# Patient Record
Sex: Female | Born: 1975
Health system: Southern US, Community
[De-identification: ages and names within clinical notes are randomized; demographics above are authoritative.]

## PROBLEM LIST (undated history)

## (undated) DIAGNOSIS — F909 Attention-deficit hyperactivity disorder, unspecified type: Secondary | ICD-10-CM

## (undated) DIAGNOSIS — B009 Herpesviral infection, unspecified: Secondary | ICD-10-CM

## (undated) DIAGNOSIS — Z9109 Other allergy status, other than to drugs and biological substances: Secondary | ICD-10-CM

## (undated) DIAGNOSIS — E079 Disorder of thyroid, unspecified: Secondary | ICD-10-CM

## (undated) DIAGNOSIS — B019 Varicella without complication: Secondary | ICD-10-CM

## (undated) DIAGNOSIS — N390 Urinary tract infection, site not specified: Secondary | ICD-10-CM

## (undated) HISTORY — DX: Attention-deficit hyperactivity disorder, unspecified type: F90.9

## (undated) HISTORY — DX: Varicella without complication: B01.9

## (undated) HISTORY — DX: Herpesviral infection, unspecified: B00.9

## (undated) HISTORY — DX: Urinary tract infection, site not specified: N39.0

## (undated) HISTORY — DX: Disorder of thyroid, unspecified: E07.9

## (undated) HISTORY — DX: Other allergy status, other than to drugs and biological substances: Z91.09

---
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
## 2009-04-21 ENCOUNTER — Encounter: Admission: RE | Admit: 2009-04-21 | Discharge: 2009-04-21 | Payer: Self-pay | Admitting: Unknown Physician Specialty

## 2010-11-01 IMAGING — CR DG ABDOMEN 2V
3 series · 3 of 3 positions shown · non-contrast
Comparison: None

CLINICAL DATA: Abdominal pain.

ABDOMEN - 2 VIEW

[view not recorded (1 of 3)]
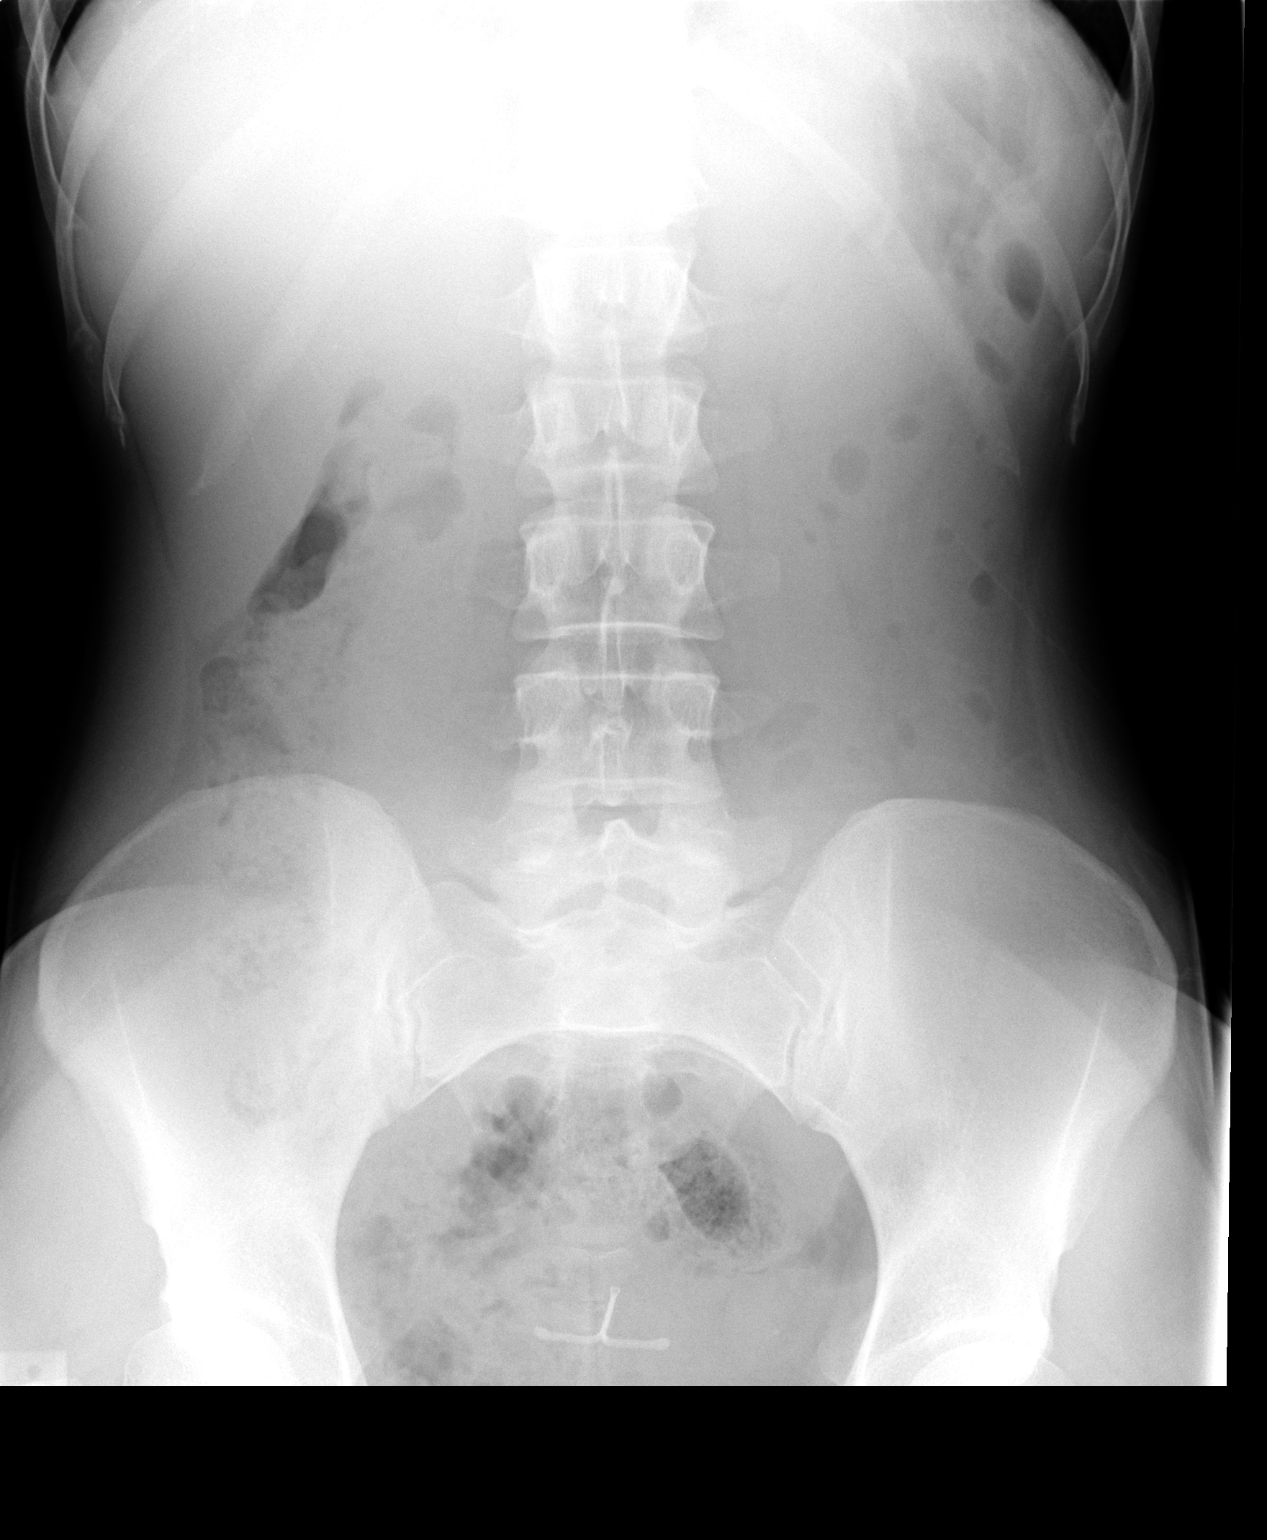

[view not recorded (2 of 3)]
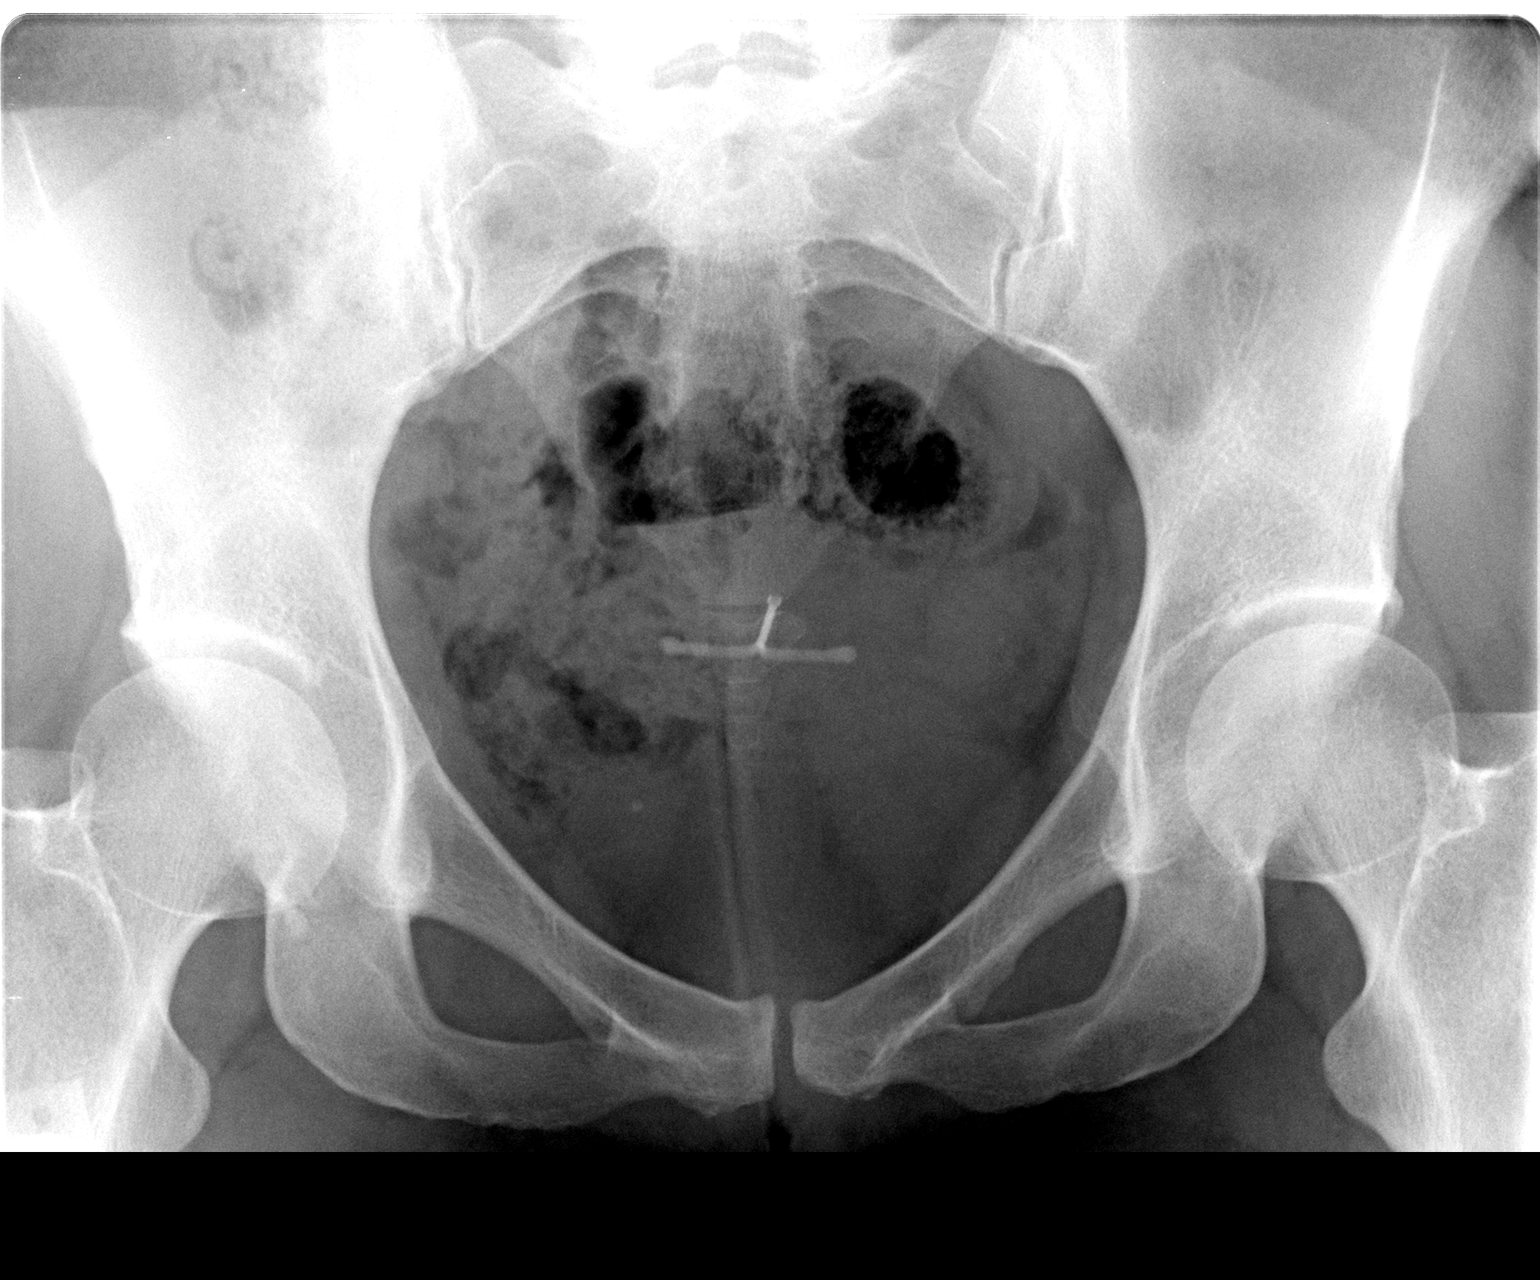

[view not recorded (3 of 3)]
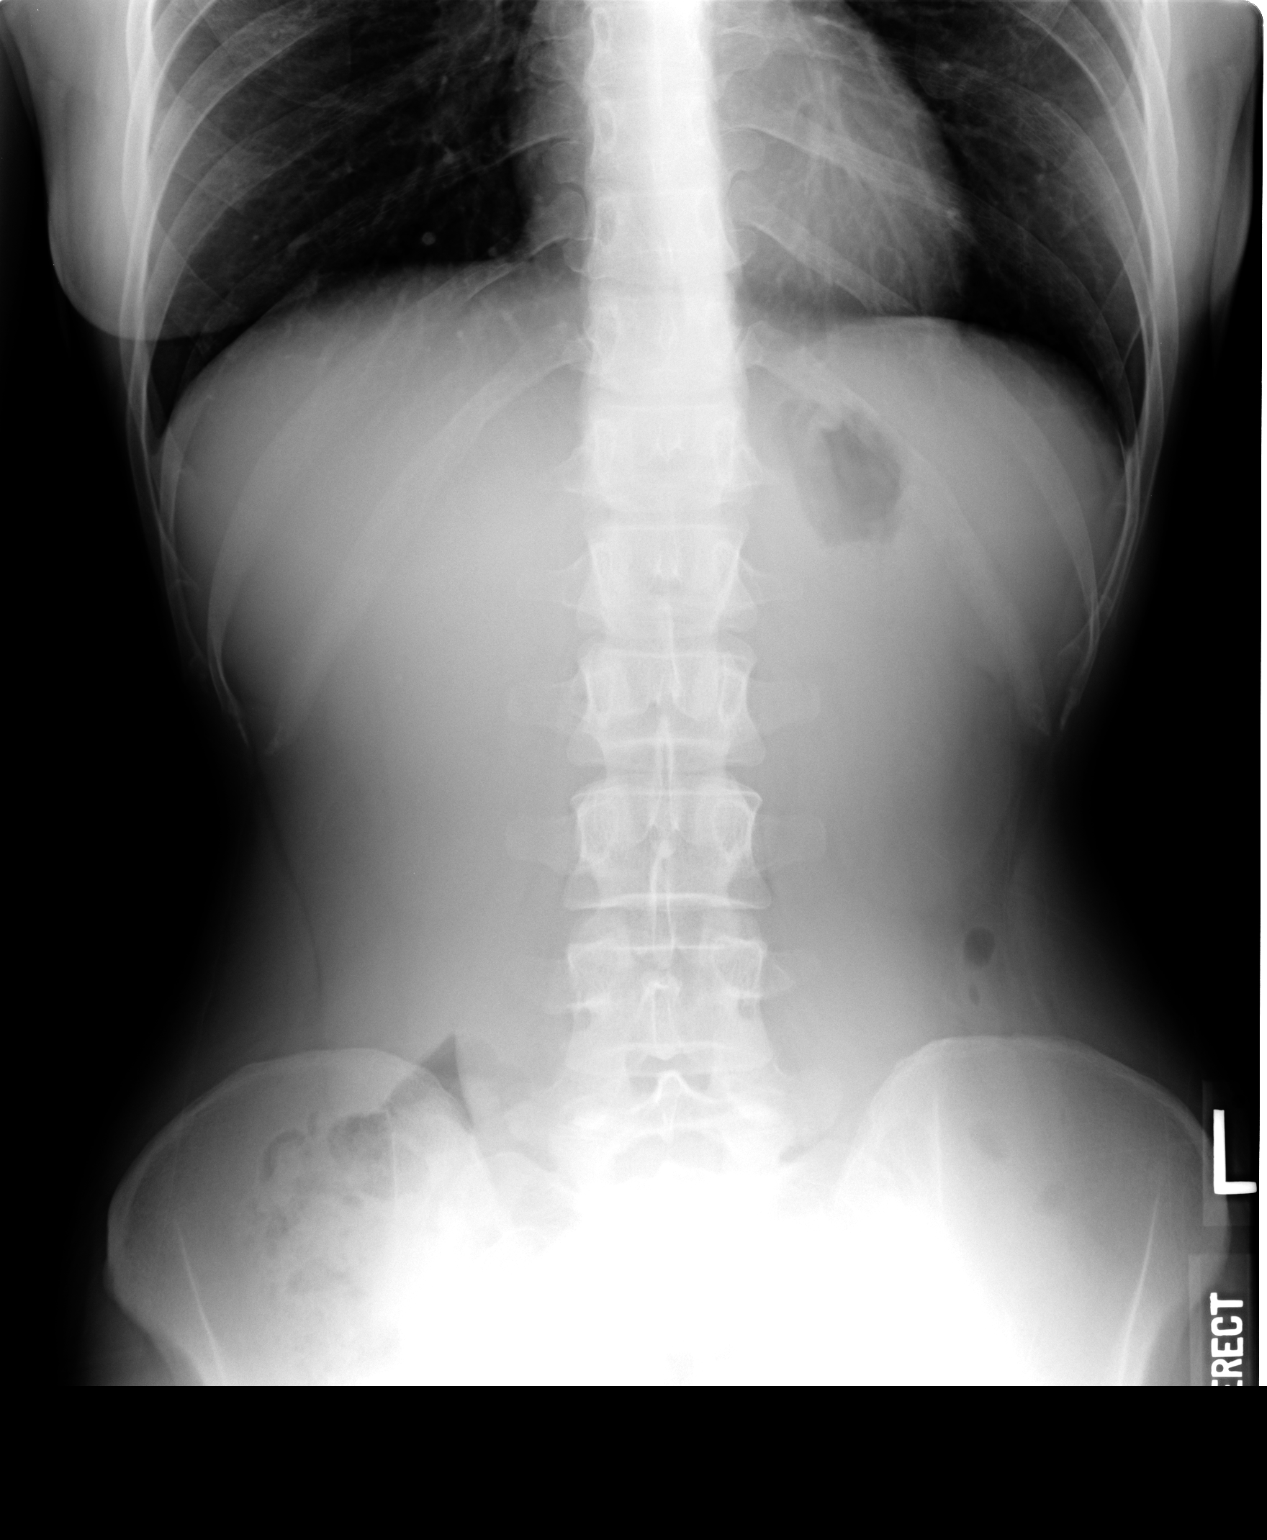

[3 of 3 positions shown; findings below may reference images not displayed]

FINDINGS: IUD noted in place. There is normal bowel gas pattern.
No free air.  No organomegaly or suspicious calcification.
Visualized skeleton unremarkable. Lung bases clear.
IMPRESSION: No acute findings.

## 2015-03-16 DIAGNOSIS — F900 Attention-deficit hyperactivity disorder, predominantly inattentive type: Secondary | ICD-10-CM | POA: Insufficient documentation

## 2016-09-19 LAB — TSH: TSH: 1.06 (ref <=5.90)

## 2017-03-20 LAB — CBC AND DIFFERENTIAL
Hemoglobin: 13.5 (ref 12.0–16.0)
Platelets: 247 (ref 150–399)
WBC: 5.7

## 2017-03-20 LAB — HEPATIC FUNCTION PANEL
ALT: 121 — AB (ref 7–35)
ALT: 20 (ref 7–35)
AST: 176 — AB (ref 13–35)
AST: 21 (ref 13–35)
Bilirubin, Direct: 0.7 — AB

## 2017-03-20 LAB — BASIC METABOLIC PANEL
GLUCOSE: 85
POTASSIUM: 3.7 (ref 3.4–5.3)
Sodium: 140 (ref 137–147)

## 2017-03-20 LAB — HM HEPATITIS C SCREENING LAB: HM HEPATITIS C SCREENING: NEGATIVE

## 2017-03-20 LAB — LIPID PANEL
CHOLESTEROL: 157 (ref 0–200)
HDL: 91 — AB (ref 35–70)
LDL Cholesterol: 62
TRIGLYCERIDES: 46 (ref 40–160)

## 2017-03-20 LAB — TSH: TSH: 1.95 (ref <=5.90)

## 2017-04-08 DIAGNOSIS — B009 Herpesviral infection, unspecified: Secondary | ICD-10-CM

## 2017-04-08 HISTORY — DX: Herpesviral infection, unspecified: B00.9

## 2017-04-08 LAB — HEPATIC FUNCTION PANEL: Bilirubin, Total: 0.8

## 2017-07-17 LAB — TSH: TSH: 1.09 (ref <=5.90)

## 2017-09-10 DIAGNOSIS — J3489 Other specified disorders of nose and nasal sinuses: Secondary | ICD-10-CM | POA: Diagnosis not present

## 2017-09-10 DIAGNOSIS — J342 Deviated nasal septum: Secondary | ICD-10-CM | POA: Diagnosis not present

## 2017-09-10 DIAGNOSIS — S0121XD Laceration without foreign body of nose, subsequent encounter: Secondary | ICD-10-CM | POA: Diagnosis not present

## 2017-09-25 DIAGNOSIS — E039 Hypothyroidism, unspecified: Secondary | ICD-10-CM | POA: Diagnosis not present

## 2017-09-25 LAB — TSH: TSH: 0.75 (ref <=5.90)

## 2017-10-04 DIAGNOSIS — F900 Attention-deficit hyperactivity disorder, predominantly inattentive type: Secondary | ICD-10-CM | POA: Diagnosis not present

## 2017-10-04 DIAGNOSIS — E039 Hypothyroidism, unspecified: Secondary | ICD-10-CM | POA: Diagnosis not present

## 2017-10-14 DIAGNOSIS — H52221 Regular astigmatism, right eye: Secondary | ICD-10-CM | POA: Diagnosis not present

## 2017-10-14 DIAGNOSIS — H5203 Hypermetropia, bilateral: Secondary | ICD-10-CM | POA: Diagnosis not present

## 2017-12-06 DIAGNOSIS — F900 Attention-deficit hyperactivity disorder, predominantly inattentive type: Secondary | ICD-10-CM | POA: Diagnosis not present

## 2017-12-06 DIAGNOSIS — E039 Hypothyroidism, unspecified: Secondary | ICD-10-CM | POA: Diagnosis not present

## 2017-12-06 LAB — TSH: TSH: 1.53 (ref <=5.90)

## 2018-01-04 DIAGNOSIS — J0140 Acute pansinusitis, unspecified: Secondary | ICD-10-CM | POA: Diagnosis not present

## 2018-03-25 DIAGNOSIS — M25552 Pain in left hip: Secondary | ICD-10-CM | POA: Diagnosis not present

## 2018-03-28 DIAGNOSIS — S0121XD Laceration without foreign body of nose, subsequent encounter: Secondary | ICD-10-CM | POA: Diagnosis not present

## 2018-03-28 DIAGNOSIS — J342 Deviated nasal septum: Secondary | ICD-10-CM | POA: Diagnosis not present

## 2018-04-14 DIAGNOSIS — F909 Attention-deficit hyperactivity disorder, unspecified type: Secondary | ICD-10-CM | POA: Diagnosis not present

## 2018-04-14 DIAGNOSIS — Z23 Encounter for immunization: Secondary | ICD-10-CM | POA: Diagnosis not present

## 2018-06-19 DIAGNOSIS — H1033 Unspecified acute conjunctivitis, bilateral: Secondary | ICD-10-CM | POA: Diagnosis not present

## 2018-08-20 DIAGNOSIS — H04123 Dry eye syndrome of bilateral lacrimal glands: Secondary | ICD-10-CM | POA: Diagnosis not present

## 2018-08-20 DIAGNOSIS — R51 Headache: Secondary | ICD-10-CM | POA: Diagnosis not present

## 2018-08-21 ENCOUNTER — Telehealth: Payer: Self-pay

## 2018-08-21 NOTE — Telephone Encounter (Signed)
Called and LM for patient on identified VM (cell phone) letting pt know we were unable to draw labs from patient because we have never seen her and she is a new patient.

## 2018-08-21 NOTE — Telephone Encounter (Signed)
Pt must be evaluated before any labs considered. She is not even established here and we do not have any records on her yet.

## 2018-08-21 NOTE — Telephone Encounter (Signed)
Copied from CRM #[id number]. Topic: General - Other >> Aug 20, 2018  1:59 PM Jaquita Rector A wrote: Reason for CRM: Patient called to say that she saw her eye doctor that think she may have a thyroid issue and would like to come in before her appointment on Monday to have blood work done so it can be discussed at the date of the scheduled appointment. Ph# [phone] >> Aug 20, 2018  2:08 PM Eulah Pont, CMA wrote: This will be a NP appt.  Does Dr Claiborne Billings want to do lab work prior to appointment?

## 2018-08-25 ENCOUNTER — Ambulatory Visit: Payer: BLUE CROSS/BLUE SHIELD | Admitting: Family Medicine

## 2018-08-25 ENCOUNTER — Encounter: Payer: Self-pay | Admitting: Family Medicine

## 2018-08-25 ENCOUNTER — Other Ambulatory Visit: Payer: Self-pay

## 2018-08-25 VITALS — BP 112/74 | HR 83 | Temp 98.1°F | Resp 14 | Ht 66.0 in | Wt 136.0 lb

## 2018-08-25 DIAGNOSIS — Z7689 Persons encountering health services in other specified circumstances: Secondary | ICD-10-CM

## 2018-08-25 DIAGNOSIS — F900 Attention-deficit hyperactivity disorder, predominantly inattentive type: Secondary | ICD-10-CM | POA: Diagnosis not present

## 2018-08-25 DIAGNOSIS — E039 Hypothyroidism, unspecified: Secondary | ICD-10-CM | POA: Insufficient documentation

## 2018-08-25 MED ORDER — LISDEXAMFETAMINE DIMESYLATE 40 MG PO CAPS: 40.0000 mg | ORAL_CAPSULE | ORAL | 0 refills | Status: AC

## 2018-08-25 MED ORDER — LISDEXAMFETAMINE DIMESYLATE 40 MG PO CAPS: 40.0000 mg | ORAL_CAPSULE | ORAL | 0 refills | Status: DC

## 2018-08-25 NOTE — Patient Instructions (Signed)
Verify your dose of thyroid med, so when we call you we can double check for accuracy.   I will refill you vyvanse today for 3 months. Follow up before needing meds.   Schedule your physical when due.  Controlled substance- vyvanse can not be prescribed during a physical or prevention visit.    Please help [place] help you:  We are honored you have chosen [facility] for your Primary Care home. Below you will find basic instructions that you may need to access in the future. Please help [place] help you by reading the instructions, which cover many of the frequent questions we experience.   Prescription refills and request:  -In order to allow more efficient response time, please call your pharmacy for all refills. They will forward the request electronically to [place]. This allows for the quickest possible response. Request left on a nurse line can take longer to refill, since these are checked as time allows between office patients and other phone calls.  - refill request can take up to 3-5 working days to complete.  - If request is sent electronically and request is appropiate, it is usually completed in 1-2 business days.  - all patients will need to be seen routinely for all chronic medical conditions requiring prescription medications (see follow-up below). If you are overdue for follow up on your condition, you will be asked to make an appointment and we will call in enough medication to cover you until your appointment (up to 30 days).  - all controlled substances will require a face to face visit to request/refill.  - if you desire your prescriptions to go through a new pharmacy, and have an active script at original pharmacy, you will need to call your pharmacy and have scripts transferred to new pharmacy. This is completed between the pharmacy locations and not by your provider.    Results: If any images or labs were ordered, it can take up to 1 week to get results depending on the test  ordered and the lab/facility running and resulting the test. - Normal or stable results, which do not need further discussion, may be released to your mychart immediately with attached note to you. A call may not be generated for normal results. Please make certain to sign up for mychart. If you have questions on how to activate your mychart you can call the front office.  - If your results need further discussion, our office will attempt to contact you via phone, and if unable to reach you after 2 attempts, we will release your abnormal result to your mychart with instructions.  - All results will be automatically released in mychart after 1 week.  - Your provider will provide you with explanation and instruction on all relevant material in your results. Please keep in mind, results and labs may appear confusing or abnormal to the untrained eye, but it does not mean they are actually abnormal for you personally. If you have any questions about your results that are not covered, or you desire more detailed explanation than what was provided, you should make an appointment with your provider to do so.   Our office handles many outgoing and incoming calls daily. If we have not contacted you within 1 week about your results, please check your mychart to see if there is a message first and if not, then contact our office.  In helping with this matter, you help decrease call volume, and therefore allow [place] to be  able to respond to patients needs more efficiently.   Acute office visits (sick visit):  An acute visit is intended for a new problem and are scheduled in shorter time slots to allow schedule openings for patients with new problems. This is the appropriate visit to discuss a new problem. Problems will not be addressed by phone call or Echart message. Appointment is needed if requesting treatment. In order to provide you with excellent quality medical care with proper time for you to explain your problem,  have an exam and receive treatment with instructions, these appointments should be limited to one new problem per visit. If you experience a new problem, in which you desire to be addressed, please make an acute office visit, we save openings on the schedule to accommodate you. Please do not save your new problem for any other type of visit, let us take care of it properly and quickly for you.   Follow up visits:  Depending on your condition(s) your provider will need to see you routinely in order to provide you with quality care and prescribe medication(s). Most chronic conditions (Example: hypertension, Diabetes, depression/anxiety... etc), require visits a couple times a year. Your provider will instruct you on proper follow up for your personal medical conditions and history. Please make certain to make follow up appointments for your condition as instructed. Failing to do so could result in lapse in your medication treatment/refills. If you request a refill, and are overdue to be seen on a condition, we will always provide you with a 30 day script (once) to allow you time to schedule.    Medicare wellness (well visit): - we have a wonderful Nurse Selena Batten(Kim), that will meet with you and provide you will yearly medicare wellness visits. These visits should occur yearly (can not be scheduled less than 1 calendar year apart) and cover preventive health, immunizations, advance directives and screenings you are entitled to yearly through your medicare benefits. Do not miss out on your entitled benefits, this is when medicare will pay for these benefits to be ordered for you.  These are strongly encouraged by your provider and is the appropriate type of visit to make certain you are up to date with all preventive health benefits. If you have not had your medicare wellness exam in the last 12 months, please make certain to schedule one by calling the office and schedule your medicare wellness with Selena BattenKim as soon as  possible.   Yearly physical (well visit):  - Adults are recommended to be seen yearly for physicals. Check with your insurance and date of your last physical, most insurances require one calendar year between physicals. Physicals include all preventive health topics, screenings, medical exam and labs that are appropriate for gender/age and history. You may have fasting labs needed at this visit. This is a well visit (not a sick visit), new problems should not be covered during this visit (see acute visit).  - Pediatric patients are seen more frequently when they are younger. Your provider will advise you on well child visit timing that is appropriate for your their age. - This is not a medicare wellness visit. Medicare wellness exams do not have an exam portion to the visit. Some medicare companies allow for a physical, some do not allow a yearly physical. If your medicare allows a yearly physical you can schedule the medicare wellness with our nurse Selena BattenKim and have your physical with your provider after, on the same day. Please check with insurance  for your full benefits.   Late Policy/No Shows:  - all new patients should arrive 15-30 minutes earlier than appointment to allow [place] time  to  obtain all personal demographics,  insurance information and for you to complete office paperwork. - All established patients should arrive 10-15 minutes earlier than appointment time to update all information and be checked in .  - In our best efforts to run on time, if you are late for your appointment you will be asked to either reschedule or if able, we will work you back into the schedule. There will be a wait time to work you back in the schedule,  depending on availability.  - If you are unable to make it to your appointment as scheduled, please call 24 hours ahead of time to allow [place] to fill the time slot with someone else who needs to be seen. If you do not cancel your appointment ahead of time, you may be charged  a no show fee.

## 2018-08-25 NOTE — Progress Notes (Signed)
Patient ID: Nicole Donovan, female  DOB: 18-Apr-1976, 43 y.o.   MRN: [id number] Patient Care Team    Relationship Specialty Notifications Start End  Nicole Donovan, Nicole Donovan PCP - General Family Medicine  08/25/18     Chief Complaint  Patient presents with  . Establish Care    Subjective:  Nicole Donovan is a 43 y.o.  female present for new patient establishment. All past medical history, surgical history, allergies, family history, immunizations, medications and social history were updated in the electronic medical record today. All recent labs, ED visits and hospitalizations within the last year were reviewed.  ADHD: Patient reports she was diagnosed in 2016 with ADHD.  She reports she took her child for evaluation with attention specialist.  Ended up undergoing her own evaluation through her attention specialist and started on Vyvanse.  She reports she takes Vyvanse 40 mg daily.  Reports she does very well on this medication without side effects.  She is in need of refills today. Indication for chronic controlled substance: ADHD Medication and dose: Vyvanse 40 mg daily  # pills per month: 30 Last UDS date: Collected today 08/25/2018 Pain contract signed (Y/N): Contract signed today 08/25/2018 Date narcotic database last reviewed (include red flags): 08/25/18  Thyroid disorder: Reviewed last 2 collections of TSH in the care everywhere tab TSH 0.752 09/25/2017.  12/06/2017 TSH 1.5.  At that time patient was prescribed nature thyroid 16.25 mg Daily.  She States She Was Told to get half a tab daily secondary to elevation in TSH.  Not find evidence in care everywhere tab of any elevation in her thyroid over the last year.  However after patient left it was noted [facility], possible lab collected at that location.  Records requested.  Patient reports she seen her eye doctor last week who felt that she had signs of exophthalmos.  She reports she had  swelling under bilateral eyes that resolved once the dose was cut in half.  She feels she has gained weight, but admits she has stopped exercising as well.  He does feel more anxious, however she is on a stimulant.  She does endorse cold hands.  She denies any palpitations, flushing or diarrhea.   Depression screen [facility] 08/25/2018  Decreased Interest 0  Down, Depressed, Hopeless 0  PHQ - 2 Score 0   GAD 7 : Generalized Anxiety Score 08/25/2018  Nervous, Anxious, on Edge 1  Control/stop worrying 1  Worry too much - different things 0  Trouble relaxing 1  Restless 1  Easily annoyed or irritable 0  Afraid - awful might happen 0  Total GAD 7 Score 4       No flowsheet data found.   Immunization History  Administered Date(s) Administered  . Influenza-Unspecified 04/15/2013, 04/14/2014, 06/30/2015, 04/08/2017    No exam data present  Past Medical History:  Diagnosis Date  . Chicken pox   . Environmental allergies   . Thyroid disease   . UTI (urinary tract infection)    No Known Allergies Past Surgical History:  Procedure Laterality Date  . TONSILLECTOMY  1985   Family History  Problem Relation Age of Onset  . Hypertension Mother   . Arthritis Father   . Diabetes Father   . Cancer Maternal Grandmother   . Depression Maternal Grandfather   . Alcohol abuse Paternal Grandfather   . Heart attack Paternal Grandfather    Social History  Social History Narrative   Marital status/children/pets: Married, 2 children.    Education/employment: Employed, Marine scientist:      -Wears a bicycle helmet riding a bike: Yes     -smoke alarm in the home:Yes     - wears seatbelt: Yes     - Feels safe in their relationships: Yes    Allergies as of 08/25/2018   No Known Allergies     Medication List       Accurate as of August 25, 2018  5:30 PM. Always use your most recent med list.        montelukast 10 MG tablet Commonly known as:  SINGULAIR Take 10 mg  by mouth daily as needed.   multivitamin tablet Take 1 tablet by mouth daily.   Omega-3 1000 MG Caps Take 1 tablet by mouth daily.   Vitamin D3 125 MCG (5000 UT) Tabs Take 1 tablet by mouth daily.   VYVANSE 40 MG capsule Generic drug:  lisdexamfetamine Take 40 mg by mouth daily.   lisdexamfetamine 40 MG capsule Commonly known as:  VYVANSE Take 1 capsule (40 mg total) by mouth every morning.   XIIDRA 5 % Soln Generic drug:  Lifitegrast Place 1 drop into both eyes 2 (two) times daily.       All past medical history, surgical history, allergies, family history, immunizations andmedications were updated in the EMR today and reviewed under the history and medication portions of their EMR.    No results found for this or any previous visit (from the past 2160 hour(s)).   ROS: 14 pt review of systems performed and negative (unless mentioned in an HPI)  Objective: BP 112/74   Pulse 83   Temp 98.1 F (36.7 C) (Oral)   Resp 14   Ht 5\' 6"  (1.676 m)   Wt 136 lb (61.7 kg)   LMP 08/04/2018   SpO2 99%   BMI 21.95 kg/m  Gen: Afebrile. No acute distress. Nontoxic in appearance, well-developed, well-nourished, pleasant Caucasian female. HENT: AT. [place]. MMM, no oral lesions, no cough on exam, no hoarseness on exam. Eyes: No obvious exophthalmos. pupils Equal Round Reactive to light, Extraocular movements intact,  Conjunctiva without redness, discharge or icterus. Neck/lymp/endocrine: Supple, no lymphadenopathy, very mild left lower thyromegaly CV: RRR no murmur Chest: CTAB, no wheeze, rhonchi or crackles.  Neuro/Msk:  Normal gait. PERLA. EOMi. Alert. Oriented x3.   Psych: Normal affect, dress and demeanor. Normal speech. Normal thought content and judgment.   Assessment/plan: Nicole Donovan is a 43 y.o. female present for est care ADD and thyroid.  Attention deficit hyperactivity disorder (ADHD), predominantly inattentive type Stable.  Refills on Vyvanse 40 mg daily provided  today. - Pain Mgmt, Profile 8 w/Conf, U Indication for chronic controlled substance: ADHD Medication and dose: Vyvanse 40 mg daily  # pills per month: 30 Last UDS date: Collected today 08/25/2018 Pain contract signed (Y/N): Contract signed today 08/25/2018 Date narcotic database last reviewed (include red flags): 08/25/18  -Follow-up 3 months.  hypothyroidism Asked her to verify medication and dose when she goes home, so that when we call her back with results we can make sure we are adjusting her medications correctly. -There is no evidence of an abnormal TSH in care everywhere, however patient was seen at [facility] physicians over the summer and could have been collected there. - TSH - T4, free -Follow-up dependent upon lab results   Return in about 3 months (around 11/23/2018) for ADD. Encouraged  her to schedule her physical yearly when due.  Note is dictated utilizing voice recognition software. Although note has been proof read prior to signing, occasional typographical errors still can be missed. If any questions arise, please Donovan not hesitate to call for verification.  Electronically signed by: Felix Pacini, [facility] [place]

## 2018-08-26 ENCOUNTER — Telehealth: Payer: Self-pay | Admitting: Family Medicine

## 2018-08-26 LAB — TSH: TSH: 1.08 u[IU]/mL (ref 0.35–4.50)

## 2018-08-26 LAB — T4, FREE: FREE T4: 0.84 ng/dL (ref 0.60–1.60)

## 2018-08-26 NOTE — Telephone Encounter (Signed)
Her thyroid fx is normal.  - She has to verify dose she is taking and please document in medications. We can then send 90d with refill x3. If she is unable to verify med- please call her pharmacy to verify dose.  I never did find evidence of the elevated level and change in med in the care everywhere tab with her last reported PCP---> did it occur at [place] maybe? We can not see their records and I believe she was seen there a few times.  - nature thyroid 16.25 mg (1/2 tab) Daily?

## 2018-08-27 LAB — PAIN MGMT, PROFILE 8 W/CONF, U
6 Acetylmorphine: NEGATIVE ng/mL (ref <10)
AMPHETAMINE: 1853 ng/mL — AB (ref <250)
Alcohol Metabolites: NEGATIVE ng/mL (ref <500)
Amphetamines: POSITIVE ng/mL — AB (ref <500)
Benzodiazepines: NEGATIVE ng/mL (ref <100)
Buprenorphine, Urine: NEGATIVE ng/mL (ref <5)
COCAINE METABOLITE: NEGATIVE ng/mL (ref <150)
Creatinine: 25.7 mg/dL
MARIJUANA METABOLITE: NEGATIVE ng/mL (ref <20)
MDMA: NEGATIVE ng/mL (ref <500)
METHAMPHETAMINE: NEGATIVE ng/mL (ref <250)
OPIATES: NEGATIVE ng/mL (ref <100)
OXIDANT: NEGATIVE ug/mL (ref <200)
OXYCODONE: NEGATIVE ng/mL (ref <100)
pH: 6.42 (ref 4.5–9.0)

## 2018-08-27 NOTE — Telephone Encounter (Signed)
Pt advised and voiced understanding.    She has apt for CPE on 09/12/18 at 2:15pm and will schedule lab visit after that apt.

## 2018-08-27 NOTE — Telephone Encounter (Signed)
SW pt, she stated that she has been off the nature thyroid x 1.5 weeks but was taking half of a 32.5mg  tab daily. She wants to know if she should start taking it or just stay off. Please advise. Thanks.   She found the records you are asking about and will be sending a copy of the them via mychart.   [facility]

## 2018-08-27 NOTE — Telephone Encounter (Signed)
Would recommend she stay off the medication altogether and she was having symptoms on an extremely low dose of thyroid replacement therapy.  I would advise her to follow-up in 6 weeks for a TSH repeat by lab appointment only to ensure it stays in range.  Please place this order and schedule her for a lab appointment.

## 2018-08-29 ENCOUNTER — Encounter: Payer: Self-pay | Admitting: Family Medicine

## 2018-09-10 ENCOUNTER — Encounter: Payer: Self-pay | Admitting: Family Medicine

## 2018-09-12 ENCOUNTER — Encounter: Payer: Self-pay | Admitting: Family Medicine

## 2018-09-12 ENCOUNTER — Ambulatory Visit (INDEPENDENT_AMBULATORY_CARE_PROVIDER_SITE_OTHER): Payer: BLUE CROSS/BLUE SHIELD | Admitting: Family Medicine

## 2018-09-12 VITALS — BP 130/82 | HR 84 | Temp 97.8°F | Resp 16 | Ht 66.0 in | Wt 140.0 lb

## 2018-09-12 DIAGNOSIS — Z23 Encounter for immunization: Secondary | ICD-10-CM

## 2018-09-12 DIAGNOSIS — Z79899 Other long term (current) drug therapy: Secondary | ICD-10-CM

## 2018-09-12 DIAGNOSIS — Z131 Encounter for screening for diabetes mellitus: Secondary | ICD-10-CM | POA: Diagnosis not present

## 2018-09-12 DIAGNOSIS — E079 Disorder of thyroid, unspecified: Secondary | ICD-10-CM | POA: Insufficient documentation

## 2018-09-12 DIAGNOSIS — Z130 Encounter for screening for diseases of the blood and blood-forming organs and certain disorders involving the immune mechanism: Secondary | ICD-10-CM

## 2018-09-12 DIAGNOSIS — Z0000 Encounter for general adult medical examination without abnormal findings: Secondary | ICD-10-CM | POA: Diagnosis not present

## 2018-09-12 DIAGNOSIS — Z1239 Encounter for other screening for malignant neoplasm of breast: Secondary | ICD-10-CM

## 2018-09-12 NOTE — Progress Notes (Signed)
Patient ID: Nicole Donovan, female  DOB: [birth year], 43 y.o.   MRN: [id number] Patient Care Team    Relationship Specialty Notifications Start End  Nicole Leatherwood, DO PCP - General Family Medicine  08/25/18     Chief Complaint  Patient presents with  . Annual Exam    Not fasting. No complaints.     Subjective:  Nicole Donovan is a 43 y.o.  Female  present for CPE. All past medical history, surgical history, allergies, family history, immunizations, medications and social history were updated in the electronic medical record today. All recent labs, ED visits and hospitalizations within the last year were reviewed.  Health maintenance:  Colonoscopy: No fhx. Routine screen 50.  Mammogram: No fhx. Ordered today.  Cervical cancer screening: last pap: 03/2016, results: normal and neg HPV, completed by: 3-5 years. Patient's last menstrual period was 09/01/2018. Regular 31 days, last 5 days.  Immunizations: tdap updated today, Influenza UTD 2019 (encouraged yearly), Infectious disease screening: HIV completed DEXA: routine screen at 60 Assistive device: none Oxygen URK:YHCW Patient has a Dental home. Hospitalizations/ED visits: reviewed  Depression screen [facility] 2/9 09/12/2018 08/25/2018  Decreased Interest 0 0  Down, Depressed, Hopeless 0 0  PHQ - 2 Score 0 0   GAD 7 : Generalized Anxiety Score 08/25/2018  Nervous, Anxious, on Edge 1  Control/stop worrying 1  Worry too much - different things 0  Trouble relaxing 1  Restless 1  Easily annoyed or irritable 0  Afraid - awful might happen 0  Total GAD 7 Score 4    Immunization History  Administered Date(s) Administered  . Influenza-Unspecified 04/15/2013, 04/14/2014, 06/30/2015, 04/08/2017  . Tdap 09/12/2018    Past Medical History:  Diagnosis Date  . ADHD   . Chicken pox   . Environmental allergies   . Human herpes simplex virus type 1 (HSV-1) DNA detected 04/08/2017  . Thyroid disease   . UTI (urinary tract  infection)    No Known Allergies Past Surgical History:  Procedure Laterality Date  . RHINOPLASTY  1992   after fracture  . TONSILLECTOMY  1985   Family History  Problem Relation Age of Onset  . Hypertension Mother   . Arthritis Father   . Diabetes Father   . Lymphoma Maternal Grandmother   . Depression Maternal Grandfather   . Alcohol abuse Paternal Grandfather   . Heart attack Paternal Grandfather    Social History   Social History Narrative   Marital status/children/pets: Married, 2 children.    Education/employment: Employed, Marine scientist:      -Wears a bicycle helmet riding a bike: Yes     -smoke alarm in the home:Yes     - wears seatbelt: Yes     - Feels safe in their relationships: Yes    Allergies as of 09/12/2018   No Known Allergies     Medication List       Accurate as of September 12, 2018  5:00 PM. Always use your most recent med list.        lisdexamfetamine 40 MG capsule Commonly known as:  Vyvanse Take 1 capsule (40 mg total) by mouth every morning.   montelukast 10 MG tablet Commonly known as:  SINGULAIR Take 10 mg by mouth daily as needed.   multivitamin tablet Take 1 tablet by mouth daily.   Omega-3 1000 MG Caps Take 1 tablet by mouth daily.   Vitamin D3 125 MCG (5000 UT) Tabs Take  1 tablet by mouth daily.   Xiidra 5 % Soln Generic drug:  Lifitegrast Place 1 drop into both eyes 2 (two) times daily.       All past medical history, surgical history, allergies, family history, immunizations andmedications were updated in the EMR today and reviewed under the history and medication portions of their EMR.      ROS: 14 pt review of systems performed and negative (unless mentioned in an HPI)  Objective: BP 130/82 (BP Location: Right Arm, Patient Position: Sitting, Cuff Size: Normal)   Pulse 84   Temp 97.8 F (36.6 C) (Oral)   Resp 16   Ht 5\' 6"  (1.676 m)   Wt 140 lb (63.5 kg)   LMP 09/01/2018   SpO2 100%   BMI 22.60  kg/m  Gen: Afebrile. No acute distress. Nontoxic in appearance, well-developed, well-nourished, pleasant Caucasian female. HENT: AT. [place]. Bilateral TM visualized and normal in appearance, normal external auditory canal. MMM, no oral lesions, adequate dentition. Bilateral nares within normal limits. Throat without erythema, ulcerations or exudates.  No cough on exam, no hoarseness on exam. Eyes:Pupils Equal Round Reactive to light, Extraocular movements intact,  Conjunctiva without redness, discharge or icterus. Neck/lymp/endocrine: Supple, no lymphadenopathy, no thyromegaly CV: RRR no murmur, no edema, +2/4 P posterior tibialis pulses.  No carotid bruits. No JVD. Chest: CTAB, no wheeze, rhonchi or crackles.  Normal respiratory effort.  Good air movement. Abd: Soft.  Flat. NTND. BS present.  No masses palpated. No hepatosplenomegaly. No rebound tenderness or guarding. Skin: No rashes, purpura or petechiae. Warm and well-perfused. Skin intact. Neuro/Msk:  Normal gait. PERLA. EOMi. Alert. Oriented x3.  Cranial nerves II through XII intact. Muscle strength 5/5 upper/lower extremity. DTRs equal bilaterally. Psych: Normal affect, dress and demeanor. Normal speech. Normal thought content and judgment.  No exam data present  Assessment/plan: Nicole Donovan is a 43 y.o. female present for CPE. Diabetes mellitus screening - Hemoglobin A1c; Future Screening for deficiency anemia - CBC; Future Encounter for long-term current use of medication - Comprehensive metabolic panel; Future Breast cancer screening - MM 3D SCREEN BREAST BILATERAL; Future Thyroid disease - complete when off meds for a total of at least 6 weeks.  - TSH; Future - T4, free; Future - Comprehensive metabolic panel; Future - Lipid panel; Future Need for Tdap vaccination - Tdap vaccine greater than or equal to 7yo IM Encounter for preventive health examination Patient was encouraged to exercise greater than 150 minutes a week.  Patient was encouraged to choose a diet filled with fresh fruits and vegetables, and lean meats. AVS provided to patient today for education/recommendation on gender specific health and safety maintenance. Colonoscopy: No fhx. Routine screen 50.  Mammogram: No fhx. Ordered today.  Cervical cancer screening: last pap: 03/2016, results: normal and neg HPV, completed by: 3-5 years. Patient's last menstrual period was 09/01/2018. Regular 31 days, last 5 days.  Immunizations: tdap updated today, Influenza UTD 2019 (encouraged yearly), Infectious disease screening: HIV completed DEXA: routine screen at 60  Return in about 1 year (around 09/12/2019) for CPE.  Electronically signed by: Felix Pacini, DO  [facility]

## 2018-09-12 NOTE — Patient Instructions (Signed)
Lab appt fasting on March 23 week.  I am glad you are doing well.   Mammogram ordered--> they will call to schedule and breast center.    Health Maintenance, Female Adopting a healthy lifestyle and getting preventive care can go a long way to promote health and wellness. Talk with your health care provider about what schedule of regular examinations is right for you. This is a good chance for you to check in with your provider about disease prevention and staying healthy. In between checkups, there are plenty of things you can do on your own. Experts have done a lot of research about which lifestyle changes and preventive measures are most likely to keep you healthy. Ask your health care provider for more information. Weight and diet Eat a healthy diet  Be sure to include plenty of vegetables, fruits, low-fat dairy products, and lean protein.  Do not eat a lot of foods high in solid fats, added sugars, or salt.  Get regular exercise. This is one of the most important things you can do for your health. ? Most adults should exercise for at least 150 minutes each week. The exercise should increase your heart rate and make you sweat (moderate-intensity exercise). ? Most adults should also do strengthening exercises at least twice a week. This is in addition to the moderate-intensity exercise. Maintain a healthy weight  Body mass index (BMI) is a measurement that can be used to identify possible weight problems. It estimates body fat based on height and weight. Your health care provider can help determine your BMI and help you achieve or maintain a healthy weight.  For females 54 years of age and older: ? A BMI below 18.5 is considered underweight. ? A BMI of 18.5 to 24.9 is normal. ? A BMI of 25 to 29.9 is considered overweight. ? A BMI of 30 and above is considered obese. Watch levels of cholesterol and blood lipids  You should start having your blood tested for lipids and cholesterol at  41 years of age, then have this test every 5 years.  You may need to have your cholesterol levels checked more often if: ? Your lipid or cholesterol levels are high. ? You are older than 41 years of age. ? You are at high risk for heart disease. Cancer screening Lung Cancer  Lung cancer screening is recommended for adults 102-65 years old who are at high risk for lung cancer because of a history of smoking.  A yearly low-dose CT scan of the lungs is recommended for people who: ? Currently smoke. ? Have quit within the past 15 years. ? Have at least a 30-pack-year history of smoking. A pack year is smoking an average of one pack of cigarettes a day for 1 year.  Yearly screening should continue until it has been 15 years since you quit.  Yearly screening should stop if you develop a health problem that would prevent you from having lung cancer treatment. Breast Cancer  Practice breast self-awareness. This means understanding how your breasts normally appear and feel.  It also means doing regular breast self-exams. Let your health care provider know about any changes, no matter how small.  If you are in your 20s or 30s, you should have a clinical breast exam (CBE) by a health care provider every 1-3 years as part of a regular health exam.  If you are 25 or older, have a CBE every year. Also consider having a breast X-ray (mammogram) every year.  If you have a family history of breast cancer, talk to your health care provider about genetic screening.  If you are at high risk for breast cancer, talk to your health care provider about having an MRI and a mammogram every year.  Breast cancer gene (BRCA) assessment is recommended for women who have family members with BRCA-related cancers. BRCA-related cancers include: ? Breast. ? Ovarian. ? Tubal. ? Peritoneal cancers.  Results of the assessment will determine the need for genetic counseling and BRCA1 and BRCA2 testing. Cervical  Cancer Your health care provider may recommend that you be screened regularly for cancer of the pelvic organs (ovaries, uterus, and vagina). This screening involves a pelvic examination, including checking for microscopic changes to the surface of your cervix (Pap test). You may be encouraged to have this screening done every 3 years, beginning at age 39.  For women ages 61-65, health care providers may recommend pelvic exams and Pap testing every 3 years, or they may recommend the Pap and pelvic exam, combined with testing for human papilloma virus (HPV), every 5 years. Some types of HPV increase your risk of cervical cancer. Testing for HPV may also be done on women of any age with unclear Pap test results.  Other health care providers may not recommend any screening for nonpregnant women who are considered low risk for pelvic cancer and who do not have symptoms. Ask your health care provider if a screening pelvic exam is right for you.  If you have had past treatment for cervical cancer or a condition that could lead to cancer, you need Pap tests and screening for cancer for at least 20 years after your treatment. If Pap tests have been discontinued, your risk factors (such as having a new sexual partner) need to be reassessed to determine if screening should resume. Some women have medical problems that increase the chance of getting cervical cancer. In these cases, your health care provider may recommend more frequent screening and Pap tests. Colorectal Cancer  This type of cancer can be detected and often prevented.  Routine colorectal cancer screening usually begins at 41 years of age and continues through 41 years of age.  Your health care provider may recommend screening at an earlier age if you have risk factors for colon cancer.  Your health care provider may also recommend using home test kits to check for hidden blood in the stool.  A small camera at the end of a tube can be used to  examine your colon directly (sigmoidoscopy or colonoscopy). This is done to check for the earliest forms of colorectal cancer.  Routine screening usually begins at age 65.  Direct examination of the colon should be repeated every 5-10 years through 40 years of age. However, you may need to be screened more often if early forms of precancerous polyps or small growths are found. Skin Cancer  Check your skin from head to toe regularly.  Tell your health care provider about any new moles or changes in moles, especially if there is a change in a mole's shape or color.  Also tell your health care provider if you have a mole that is larger than the size of a pencil eraser.  Always use sunscreen. Apply sunscreen liberally and repeatedly throughout the day.  Protect yourself by wearing long sleeves, pants, a wide-brimmed hat, and sunglasses whenever you are outside. Heart disease, diabetes, and high blood pressure  High blood pressure causes heart disease and increases the risk of  stroke. High blood pressure is more likely to develop in: ? People who have blood pressure in the high end of the normal range (130-139/85-89 mm Hg). ? People who are overweight or obese. ? People who are African American.  If you are 29-61 years of age, have your blood pressure checked every 3-5 years. If you are 66 years of age or older, have your blood pressure checked every year. You should have your blood pressure measured twice-once when you are at a hospital or clinic, and once when you are not at a hospital or clinic. Record the average of the two measurements. To check your blood pressure when you are not at a hospital or clinic, you can use: ? An automated blood pressure machine at a pharmacy. ? A home blood pressure monitor.  If you are between 59 years and 67 years old, ask your health care provider if you should take aspirin to prevent strokes.  Have regular diabetes screenings. This involves taking a blood  sample to check your fasting blood sugar level. ? If you are at a normal weight and have a low risk for diabetes, have this test once every three years after 41 years of age. ? If you are overweight and have a high risk for diabetes, consider being tested at a younger age or more often. Preventing infection Hepatitis B  If you have a higher risk for hepatitis B, you should be screened for this virus. You are considered at high risk for hepatitis B if: ? You were born in a country where hepatitis B is common. Ask your health care provider which countries are considered high risk. ? Your parents were born in a high-risk country, and you have not been immunized against hepatitis B (hepatitis B vaccine). ? You have HIV or AIDS. ? You use needles to inject street drugs. ? You live with someone who has hepatitis B. ? You have had sex with someone who has hepatitis B. ? You get hemodialysis treatment. ? You take certain medicines for conditions, including cancer, organ transplantation, and autoimmune conditions. Hepatitis C  Blood testing is recommended for: ? Everyone born from [birth year] through 1963. ? Anyone with known risk factors for hepatitis C. Sexually transmitted infections (STIs)  You should be screened for sexually transmitted infections (STIs) including gonorrhea and chlamydia if: ? You are sexually active and are younger than 42 years of age. ? You are older than 41 years of age and your health care provider tells you that you are at risk for this type of infection. ? Your sexual activity has changed since you were last screened and you are at an increased risk for chlamydia or gonorrhea. Ask your health care provider if you are at risk.  If you do not have HIV, but are at risk, it may be recommended that you take a prescription medicine daily to prevent HIV infection. This is called pre-exposure prophylaxis (PrEP). You are considered at risk if: ? You are sexually active and do not  regularly use condoms or know the HIV status of your partner(s). ? You take drugs by injection. ? You are sexually active with a partner who has HIV. Talk with your health care provider about whether you are at high risk of being infected with HIV. If you choose to begin PrEP, you should first be tested for HIV. You should then be tested every 3 months for as long as you are taking PrEP. Pregnancy  If you are premenopausal  and you may become pregnant, ask your health care provider about preconception counseling.  If you may become pregnant, take 400 to 800 micrograms (mcg) of folic acid every day.  If you want to prevent pregnancy, talk to your health care provider about birth control (contraception). Osteoporosis and menopause  Osteoporosis is a disease in which the bones lose minerals and strength with aging. This can result in serious bone fractures. Your risk for osteoporosis can be identified using a bone density scan.  If you are 68 years of age or older, or if you are at risk for osteoporosis and fractures, ask your health care provider if you should be screened.  Ask your health care provider whether you should take a calcium or vitamin D supplement to lower your risk for osteoporosis.  Menopause may have certain physical symptoms and risks.  Hormone replacement therapy may reduce some of these symptoms and risks. Talk to your health care provider about whether hormone replacement therapy is right for you. Follow these instructions at home:  Schedule regular health, dental, and eye exams.  Stay current with your immunizations.  Do not use any tobacco products including cigarettes, chewing tobacco, or electronic cigarettes.  If you are pregnant, do not drink alcohol.  If you are breastfeeding, limit how much and how often you drink alcohol.  Limit alcohol intake to no more than 1 drink per day for nonpregnant women. One drink equals 12 ounces of beer, 5 ounces of wine, or 1  ounces of hard liquor.  Do not use street drugs.  Do not share needles.  Ask your health care provider for help if you need support or information about quitting drugs.  Tell your health care provider if you often feel depressed.  Tell your health care provider if you have ever been abused or do not feel safe at home. This information is not intended to replace advice given to you by your health care provider. Make sure you discuss any questions you have with your health care provider. Document Released: 01/08/2011 Document Revised: 12/01/2015 Document Reviewed: 03/29/2015 Elsevier Interactive Patient Education  2019 [employer].

## 2018-09-30 ENCOUNTER — Other Ambulatory Visit: Payer: BLUE CROSS/BLUE SHIELD

## 2018-12-02 ENCOUNTER — Ambulatory Visit: Payer: BLUE CROSS/BLUE SHIELD

## 2018-12-16 DIAGNOSIS — H5203 Hypermetropia, bilateral: Secondary | ICD-10-CM | POA: Diagnosis not present

## 2019-01-06 DIAGNOSIS — L700 Acne vulgaris: Secondary | ICD-10-CM | POA: Diagnosis not present

## 2019-01-06 DIAGNOSIS — L821 Other seborrheic keratosis: Secondary | ICD-10-CM | POA: Diagnosis not present

## 2019-01-06 DIAGNOSIS — L309 Dermatitis, unspecified: Secondary | ICD-10-CM | POA: Diagnosis not present

## 2019-01-06 DIAGNOSIS — L578 Other skin changes due to chronic exposure to nonionizing radiation: Secondary | ICD-10-CM | POA: Diagnosis not present

## 2019-01-27 ENCOUNTER — Ambulatory Visit
Admission: RE | Admit: 2019-01-27 | Discharge: 2019-01-27 | Disposition: A | Discharge status: Home / Self Care | Payer: BC Managed Care – PPO | Source: Ambulatory Visit | Attending: Family Medicine | Admitting: Family Medicine

## 2019-01-27 ENCOUNTER — Other Ambulatory Visit: Payer: Self-pay

## 2019-01-27 DIAGNOSIS — Z1231 Encounter for screening mammogram for malignant neoplasm of breast: Secondary | ICD-10-CM | POA: Diagnosis not present

## 2019-01-27 DIAGNOSIS — Z1239 Encounter for other screening for malignant neoplasm of breast: Secondary | ICD-10-CM

## 2019-02-23 DIAGNOSIS — Z20828 Contact with and (suspected) exposure to other viral communicable diseases: Secondary | ICD-10-CM | POA: Diagnosis not present

## 2019-04-02 DIAGNOSIS — E039 Hypothyroidism, unspecified: Secondary | ICD-10-CM | POA: Diagnosis not present

## 2019-05-05 DIAGNOSIS — M79641 Pain in right hand: Secondary | ICD-10-CM | POA: Diagnosis not present

## 2019-07-15 DIAGNOSIS — H6123 Impacted cerumen, bilateral: Secondary | ICD-10-CM | POA: Diagnosis not present

## 2019-07-15 DIAGNOSIS — R519 Headache, unspecified: Secondary | ICD-10-CM | POA: Diagnosis not present

## 2019-07-24 DIAGNOSIS — Z20822 Contact with and (suspected) exposure to covid-19: Secondary | ICD-10-CM | POA: Diagnosis not present

## 2019-07-24 DIAGNOSIS — Z03818 Encounter for observation for suspected exposure to other biological agents ruled out: Secondary | ICD-10-CM | POA: Diagnosis not present

## 2019-07-28 DIAGNOSIS — Z03818 Encounter for observation for suspected exposure to other biological agents ruled out: Secondary | ICD-10-CM | POA: Diagnosis not present

## 2019-07-28 DIAGNOSIS — Z20822 Contact with and (suspected) exposure to covid-19: Secondary | ICD-10-CM | POA: Diagnosis not present

## 2019-08-05 DIAGNOSIS — Z20822 Contact with and (suspected) exposure to covid-19: Secondary | ICD-10-CM | POA: Diagnosis not present

## 2019-08-05 DIAGNOSIS — Z03818 Encounter for observation for suspected exposure to other biological agents ruled out: Secondary | ICD-10-CM | POA: Diagnosis not present

## 2019-08-05 DIAGNOSIS — Z9189 Other specified personal risk factors, not elsewhere classified: Secondary | ICD-10-CM | POA: Diagnosis not present

## 2019-08-26 DIAGNOSIS — R1030 Lower abdominal pain, unspecified: Secondary | ICD-10-CM | POA: Diagnosis not present

## 2019-09-29 DIAGNOSIS — J342 Deviated nasal septum: Secondary | ICD-10-CM | POA: Diagnosis not present

## 2019-09-29 DIAGNOSIS — J3489 Other specified disorders of nose and nasal sinuses: Secondary | ICD-10-CM | POA: Diagnosis not present

## 2019-09-29 DIAGNOSIS — R448 Other symptoms and signs involving general sensations and perceptions: Secondary | ICD-10-CM | POA: Diagnosis not present

## 2019-10-06 DIAGNOSIS — I8312 Varicose veins of left lower extremity with inflammation: Secondary | ICD-10-CM | POA: Diagnosis not present

## 2019-10-14 DIAGNOSIS — I8312 Varicose veins of left lower extremity with inflammation: Secondary | ICD-10-CM | POA: Diagnosis not present

## 2019-11-03 DIAGNOSIS — M9903 Segmental and somatic dysfunction of lumbar region: Secondary | ICD-10-CM | POA: Diagnosis not present

## 2019-11-03 DIAGNOSIS — M9902 Segmental and somatic dysfunction of thoracic region: Secondary | ICD-10-CM | POA: Diagnosis not present

## 2019-11-03 DIAGNOSIS — M9901 Segmental and somatic dysfunction of cervical region: Secondary | ICD-10-CM | POA: Diagnosis not present

## 2019-11-03 DIAGNOSIS — M9904 Segmental and somatic dysfunction of sacral region: Secondary | ICD-10-CM | POA: Diagnosis not present

## 2019-11-04 DIAGNOSIS — M9902 Segmental and somatic dysfunction of thoracic region: Secondary | ICD-10-CM | POA: Diagnosis not present

## 2019-11-04 DIAGNOSIS — M9901 Segmental and somatic dysfunction of cervical region: Secondary | ICD-10-CM | POA: Diagnosis not present

## 2019-11-04 DIAGNOSIS — M9903 Segmental and somatic dysfunction of lumbar region: Secondary | ICD-10-CM | POA: Diagnosis not present

## 2019-11-04 DIAGNOSIS — M9904 Segmental and somatic dysfunction of sacral region: Secondary | ICD-10-CM | POA: Diagnosis not present

## 2019-11-05 DIAGNOSIS — M9903 Segmental and somatic dysfunction of lumbar region: Secondary | ICD-10-CM | POA: Diagnosis not present

## 2019-11-05 DIAGNOSIS — M9902 Segmental and somatic dysfunction of thoracic region: Secondary | ICD-10-CM | POA: Diagnosis not present

## 2019-11-05 DIAGNOSIS — M9904 Segmental and somatic dysfunction of sacral region: Secondary | ICD-10-CM | POA: Diagnosis not present

## 2019-11-05 DIAGNOSIS — M9901 Segmental and somatic dysfunction of cervical region: Secondary | ICD-10-CM | POA: Diagnosis not present

## 2019-11-12 DIAGNOSIS — M9903 Segmental and somatic dysfunction of lumbar region: Secondary | ICD-10-CM | POA: Diagnosis not present

## 2019-11-12 DIAGNOSIS — M9901 Segmental and somatic dysfunction of cervical region: Secondary | ICD-10-CM | POA: Diagnosis not present

## 2019-11-12 DIAGNOSIS — M9902 Segmental and somatic dysfunction of thoracic region: Secondary | ICD-10-CM | POA: Diagnosis not present

## 2019-11-12 DIAGNOSIS — M9904 Segmental and somatic dysfunction of sacral region: Secondary | ICD-10-CM | POA: Diagnosis not present

## 2019-11-16 DIAGNOSIS — M9903 Segmental and somatic dysfunction of lumbar region: Secondary | ICD-10-CM | POA: Diagnosis not present

## 2019-11-16 DIAGNOSIS — M9901 Segmental and somatic dysfunction of cervical region: Secondary | ICD-10-CM | POA: Diagnosis not present

## 2019-11-16 DIAGNOSIS — M9902 Segmental and somatic dysfunction of thoracic region: Secondary | ICD-10-CM | POA: Diagnosis not present

## 2019-11-16 DIAGNOSIS — M9904 Segmental and somatic dysfunction of sacral region: Secondary | ICD-10-CM | POA: Diagnosis not present

## 2019-11-19 DIAGNOSIS — M9901 Segmental and somatic dysfunction of cervical region: Secondary | ICD-10-CM | POA: Diagnosis not present

## 2019-11-19 DIAGNOSIS — M9902 Segmental and somatic dysfunction of thoracic region: Secondary | ICD-10-CM | POA: Diagnosis not present

## 2019-11-19 DIAGNOSIS — M9904 Segmental and somatic dysfunction of sacral region: Secondary | ICD-10-CM | POA: Diagnosis not present

## 2019-11-19 DIAGNOSIS — M9903 Segmental and somatic dysfunction of lumbar region: Secondary | ICD-10-CM | POA: Diagnosis not present

## 2019-11-23 DIAGNOSIS — M9904 Segmental and somatic dysfunction of sacral region: Secondary | ICD-10-CM | POA: Diagnosis not present

## 2019-11-23 DIAGNOSIS — M9903 Segmental and somatic dysfunction of lumbar region: Secondary | ICD-10-CM | POA: Diagnosis not present

## 2019-11-23 DIAGNOSIS — M9901 Segmental and somatic dysfunction of cervical region: Secondary | ICD-10-CM | POA: Diagnosis not present

## 2019-11-23 DIAGNOSIS — M9902 Segmental and somatic dysfunction of thoracic region: Secondary | ICD-10-CM | POA: Diagnosis not present

## 2019-11-24 DIAGNOSIS — M9901 Segmental and somatic dysfunction of cervical region: Secondary | ICD-10-CM | POA: Diagnosis not present

## 2019-11-24 DIAGNOSIS — M9903 Segmental and somatic dysfunction of lumbar region: Secondary | ICD-10-CM | POA: Diagnosis not present

## 2019-11-24 DIAGNOSIS — M9902 Segmental and somatic dysfunction of thoracic region: Secondary | ICD-10-CM | POA: Diagnosis not present

## 2019-11-24 DIAGNOSIS — M9904 Segmental and somatic dysfunction of sacral region: Secondary | ICD-10-CM | POA: Diagnosis not present

## 2019-11-30 DIAGNOSIS — M9902 Segmental and somatic dysfunction of thoracic region: Secondary | ICD-10-CM | POA: Diagnosis not present

## 2019-11-30 DIAGNOSIS — M9904 Segmental and somatic dysfunction of sacral region: Secondary | ICD-10-CM | POA: Diagnosis not present

## 2019-11-30 DIAGNOSIS — M9903 Segmental and somatic dysfunction of lumbar region: Secondary | ICD-10-CM | POA: Diagnosis not present

## 2019-11-30 DIAGNOSIS — M9901 Segmental and somatic dysfunction of cervical region: Secondary | ICD-10-CM | POA: Diagnosis not present

## 2019-12-01 DIAGNOSIS — M9903 Segmental and somatic dysfunction of lumbar region: Secondary | ICD-10-CM | POA: Diagnosis not present

## 2019-12-01 DIAGNOSIS — M9904 Segmental and somatic dysfunction of sacral region: Secondary | ICD-10-CM | POA: Diagnosis not present

## 2019-12-01 DIAGNOSIS — M9902 Segmental and somatic dysfunction of thoracic region: Secondary | ICD-10-CM | POA: Diagnosis not present

## 2019-12-01 DIAGNOSIS — M9901 Segmental and somatic dysfunction of cervical region: Secondary | ICD-10-CM | POA: Diagnosis not present

## 2019-12-03 DIAGNOSIS — M9903 Segmental and somatic dysfunction of lumbar region: Secondary | ICD-10-CM | POA: Diagnosis not present

## 2019-12-03 DIAGNOSIS — M9904 Segmental and somatic dysfunction of sacral region: Secondary | ICD-10-CM | POA: Diagnosis not present

## 2019-12-03 DIAGNOSIS — M9901 Segmental and somatic dysfunction of cervical region: Secondary | ICD-10-CM | POA: Diagnosis not present

## 2019-12-03 DIAGNOSIS — M9902 Segmental and somatic dysfunction of thoracic region: Secondary | ICD-10-CM | POA: Diagnosis not present

## 2019-12-08 DIAGNOSIS — M9903 Segmental and somatic dysfunction of lumbar region: Secondary | ICD-10-CM | POA: Diagnosis not present

## 2019-12-08 DIAGNOSIS — M9904 Segmental and somatic dysfunction of sacral region: Secondary | ICD-10-CM | POA: Diagnosis not present

## 2019-12-08 DIAGNOSIS — M9901 Segmental and somatic dysfunction of cervical region: Secondary | ICD-10-CM | POA: Diagnosis not present

## 2019-12-08 DIAGNOSIS — M9902 Segmental and somatic dysfunction of thoracic region: Secondary | ICD-10-CM | POA: Diagnosis not present

## 2019-12-10 DIAGNOSIS — M9901 Segmental and somatic dysfunction of cervical region: Secondary | ICD-10-CM | POA: Diagnosis not present

## 2019-12-10 DIAGNOSIS — M9902 Segmental and somatic dysfunction of thoracic region: Secondary | ICD-10-CM | POA: Diagnosis not present

## 2019-12-10 DIAGNOSIS — M9903 Segmental and somatic dysfunction of lumbar region: Secondary | ICD-10-CM | POA: Diagnosis not present

## 2019-12-10 DIAGNOSIS — M9904 Segmental and somatic dysfunction of sacral region: Secondary | ICD-10-CM | POA: Diagnosis not present

## 2019-12-15 DIAGNOSIS — M9902 Segmental and somatic dysfunction of thoracic region: Secondary | ICD-10-CM | POA: Diagnosis not present

## 2019-12-15 DIAGNOSIS — M9903 Segmental and somatic dysfunction of lumbar region: Secondary | ICD-10-CM | POA: Diagnosis not present

## 2019-12-15 DIAGNOSIS — M9904 Segmental and somatic dysfunction of sacral region: Secondary | ICD-10-CM | POA: Diagnosis not present

## 2019-12-15 DIAGNOSIS — M9901 Segmental and somatic dysfunction of cervical region: Secondary | ICD-10-CM | POA: Diagnosis not present

## 2019-12-17 DIAGNOSIS — M9903 Segmental and somatic dysfunction of lumbar region: Secondary | ICD-10-CM | POA: Diagnosis not present

## 2019-12-17 DIAGNOSIS — M9901 Segmental and somatic dysfunction of cervical region: Secondary | ICD-10-CM | POA: Diagnosis not present

## 2019-12-17 DIAGNOSIS — M9904 Segmental and somatic dysfunction of sacral region: Secondary | ICD-10-CM | POA: Diagnosis not present

## 2019-12-17 DIAGNOSIS — M9902 Segmental and somatic dysfunction of thoracic region: Secondary | ICD-10-CM | POA: Diagnosis not present

## 2019-12-22 DIAGNOSIS — M9902 Segmental and somatic dysfunction of thoracic region: Secondary | ICD-10-CM | POA: Diagnosis not present

## 2019-12-22 DIAGNOSIS — M9901 Segmental and somatic dysfunction of cervical region: Secondary | ICD-10-CM | POA: Diagnosis not present

## 2019-12-22 DIAGNOSIS — M9903 Segmental and somatic dysfunction of lumbar region: Secondary | ICD-10-CM | POA: Diagnosis not present

## 2019-12-22 DIAGNOSIS — M9904 Segmental and somatic dysfunction of sacral region: Secondary | ICD-10-CM | POA: Diagnosis not present

## 2019-12-29 DIAGNOSIS — M9902 Segmental and somatic dysfunction of thoracic region: Secondary | ICD-10-CM | POA: Diagnosis not present

## 2019-12-29 DIAGNOSIS — M9904 Segmental and somatic dysfunction of sacral region: Secondary | ICD-10-CM | POA: Diagnosis not present

## 2019-12-29 DIAGNOSIS — M9903 Segmental and somatic dysfunction of lumbar region: Secondary | ICD-10-CM | POA: Diagnosis not present

## 2019-12-29 DIAGNOSIS — M9901 Segmental and somatic dysfunction of cervical region: Secondary | ICD-10-CM | POA: Diagnosis not present

## 2019-12-30 DIAGNOSIS — I8312 Varicose veins of left lower extremity with inflammation: Secondary | ICD-10-CM | POA: Diagnosis not present

## 2020-01-06 DIAGNOSIS — M9901 Segmental and somatic dysfunction of cervical region: Secondary | ICD-10-CM | POA: Diagnosis not present

## 2020-01-06 DIAGNOSIS — M9904 Segmental and somatic dysfunction of sacral region: Secondary | ICD-10-CM | POA: Diagnosis not present

## 2020-01-06 DIAGNOSIS — M9903 Segmental and somatic dysfunction of lumbar region: Secondary | ICD-10-CM | POA: Diagnosis not present

## 2020-01-06 DIAGNOSIS — M9902 Segmental and somatic dysfunction of thoracic region: Secondary | ICD-10-CM | POA: Diagnosis not present

## 2020-01-21 DIAGNOSIS — M9903 Segmental and somatic dysfunction of lumbar region: Secondary | ICD-10-CM | POA: Diagnosis not present

## 2020-01-21 DIAGNOSIS — M9902 Segmental and somatic dysfunction of thoracic region: Secondary | ICD-10-CM | POA: Diagnosis not present

## 2020-01-21 DIAGNOSIS — M9901 Segmental and somatic dysfunction of cervical region: Secondary | ICD-10-CM | POA: Diagnosis not present

## 2020-01-21 DIAGNOSIS — M9904 Segmental and somatic dysfunction of sacral region: Secondary | ICD-10-CM | POA: Diagnosis not present

## 2020-02-04 DIAGNOSIS — M9903 Segmental and somatic dysfunction of lumbar region: Secondary | ICD-10-CM | POA: Diagnosis not present

## 2020-02-04 DIAGNOSIS — M9901 Segmental and somatic dysfunction of cervical region: Secondary | ICD-10-CM | POA: Diagnosis not present

## 2020-02-04 DIAGNOSIS — M9902 Segmental and somatic dysfunction of thoracic region: Secondary | ICD-10-CM | POA: Diagnosis not present

## 2020-02-04 DIAGNOSIS — M9904 Segmental and somatic dysfunction of sacral region: Secondary | ICD-10-CM | POA: Diagnosis not present

## 2020-02-09 DIAGNOSIS — Z1231 Encounter for screening mammogram for malignant neoplasm of breast: Secondary | ICD-10-CM | POA: Diagnosis not present

## 2020-02-29 DIAGNOSIS — R922 Inconclusive mammogram: Secondary | ICD-10-CM | POA: Diagnosis not present

## 2020-02-29 DIAGNOSIS — R928 Other abnormal and inconclusive findings on diagnostic imaging of breast: Secondary | ICD-10-CM | POA: Diagnosis not present

## 2020-03-02 DIAGNOSIS — Z124 Encounter for screening for malignant neoplasm of cervix: Secondary | ICD-10-CM | POA: Diagnosis not present

## 2020-03-02 DIAGNOSIS — Z131 Encounter for screening for diabetes mellitus: Secondary | ICD-10-CM | POA: Diagnosis not present

## 2020-03-02 DIAGNOSIS — Z13220 Encounter for screening for lipoid disorders: Secondary | ICD-10-CM | POA: Diagnosis not present

## 2020-03-02 DIAGNOSIS — Z0000 Encounter for general adult medical examination without abnormal findings: Secondary | ICD-10-CM | POA: Diagnosis not present

## 2020-03-02 DIAGNOSIS — R7989 Other specified abnormal findings of blood chemistry: Secondary | ICD-10-CM | POA: Diagnosis not present

## 2020-04-12 DIAGNOSIS — I8312 Varicose veins of left lower extremity with inflammation: Secondary | ICD-10-CM | POA: Diagnosis not present

## 2020-04-18 DIAGNOSIS — L570 Actinic keratosis: Secondary | ICD-10-CM | POA: Diagnosis not present

## 2020-04-18 DIAGNOSIS — L245 Irritant contact dermatitis due to other chemical products: Secondary | ICD-10-CM | POA: Diagnosis not present

## 2020-05-13 DIAGNOSIS — I8312 Varicose veins of left lower extremity with inflammation: Secondary | ICD-10-CM | POA: Diagnosis not present

## 2020-05-16 DIAGNOSIS — I8312 Varicose veins of left lower extremity with inflammation: Secondary | ICD-10-CM | POA: Diagnosis not present

## 2020-05-30 DIAGNOSIS — I83812 Varicose veins of left lower extremities with pain: Secondary | ICD-10-CM | POA: Diagnosis not present

## 2020-05-30 DIAGNOSIS — I8312 Varicose veins of left lower extremity with inflammation: Secondary | ICD-10-CM | POA: Diagnosis not present

## 2020-06-27 DIAGNOSIS — I8312 Varicose veins of left lower extremity with inflammation: Secondary | ICD-10-CM | POA: Diagnosis not present

## 2020-08-08 IMAGING — MG DIGITAL SCREENING BILATERAL MAMMOGRAM WITH TOMO AND CAD
8 series · 9 of 24 positions shown · non-contrast
Comparison: None.

CLINICAL DATA: Screening.

EXAM:
DIGITAL SCREENING BILATERAL MAMMOGRAM WITH TOMO AND CAD

[L CC synth-2D]
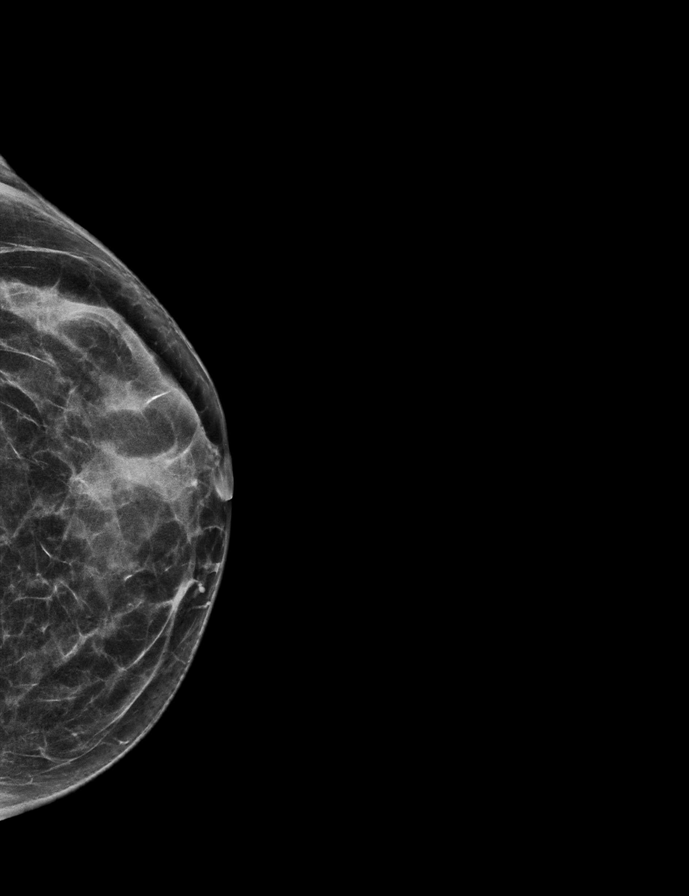

[R MLO synth-2D]
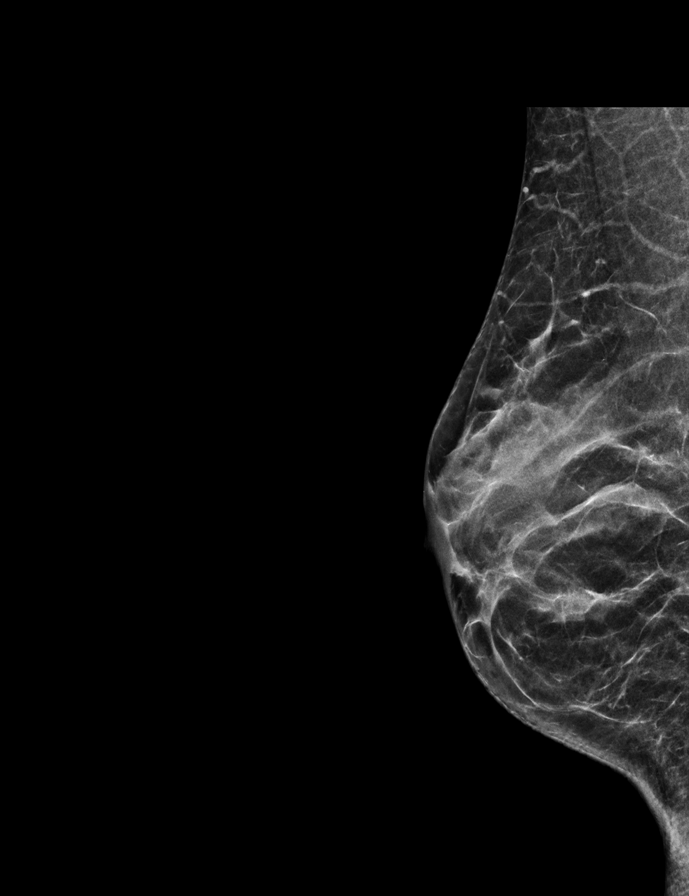

[L MLO synth-2D]
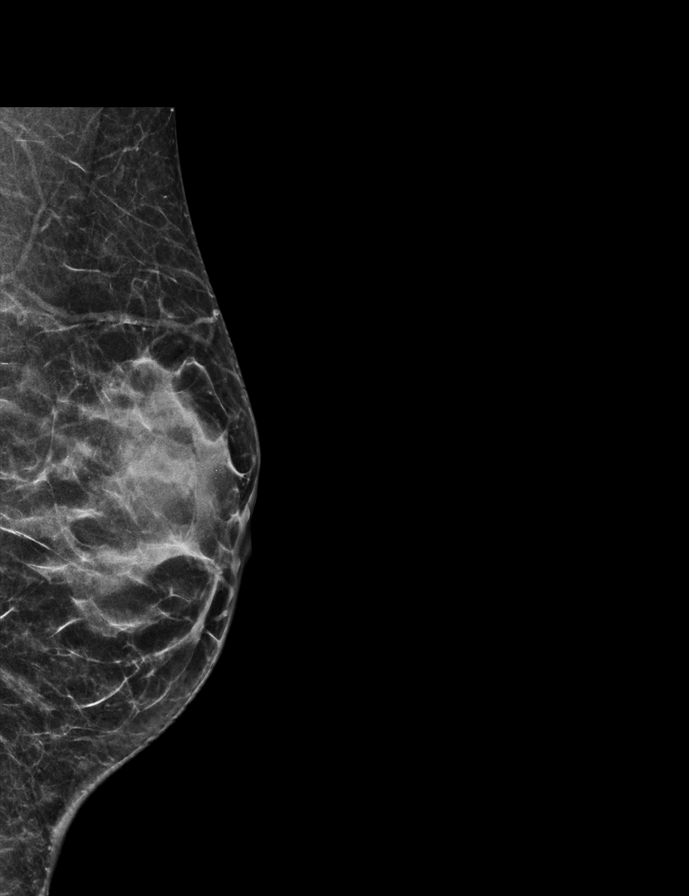

[R CC synth-2D]
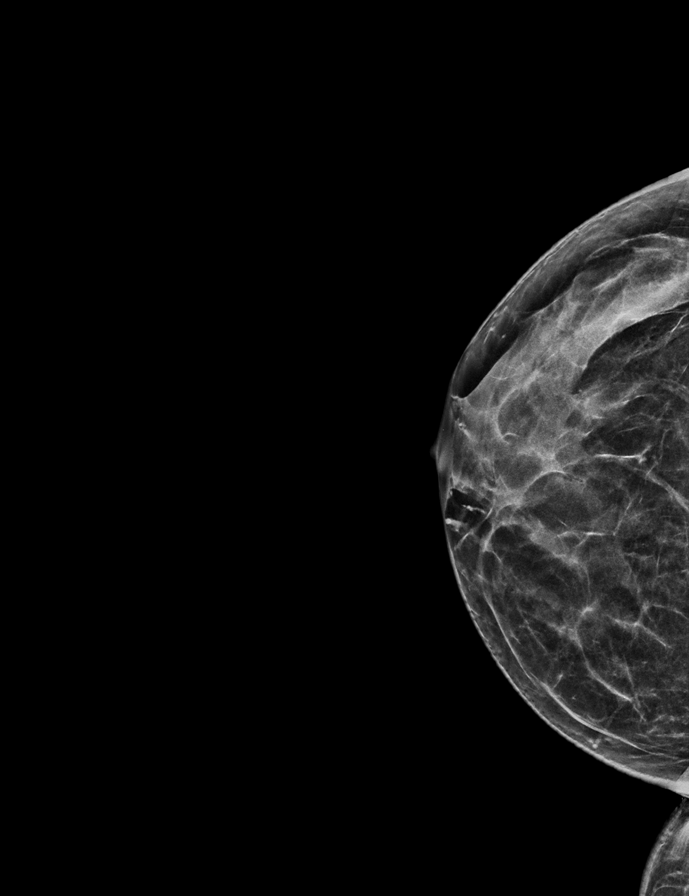

[R CC tomo · 2 of 46 frames shown]
[frame 15/46]
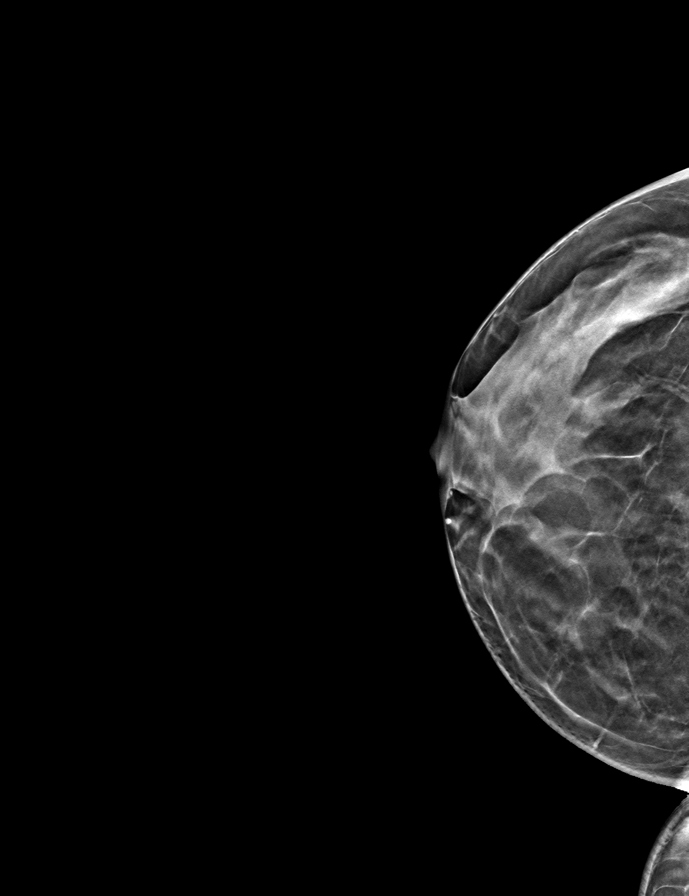
[frame 23/46]
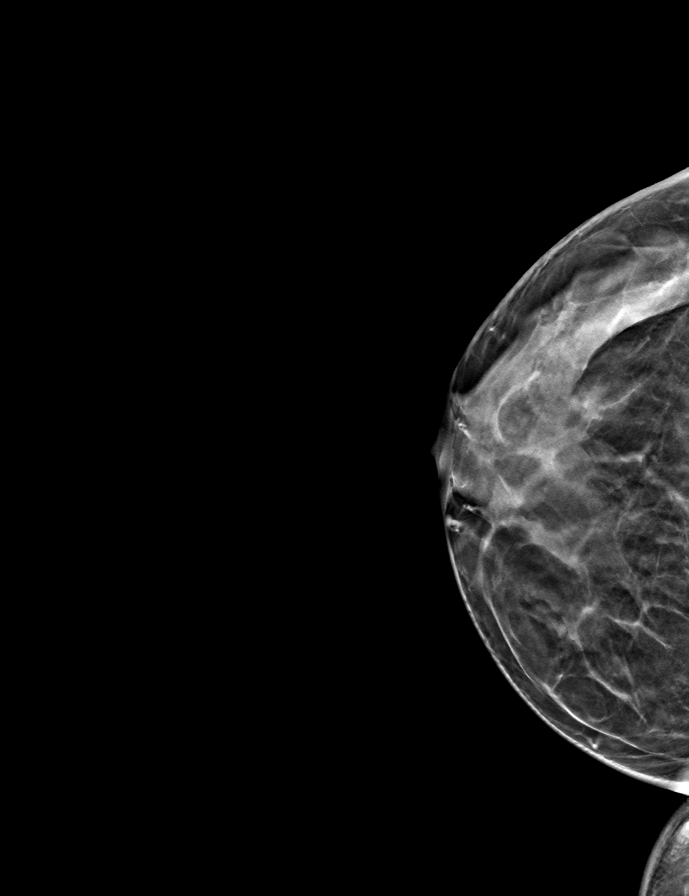

[R MLO tomo · tomo slice 22/43.0]
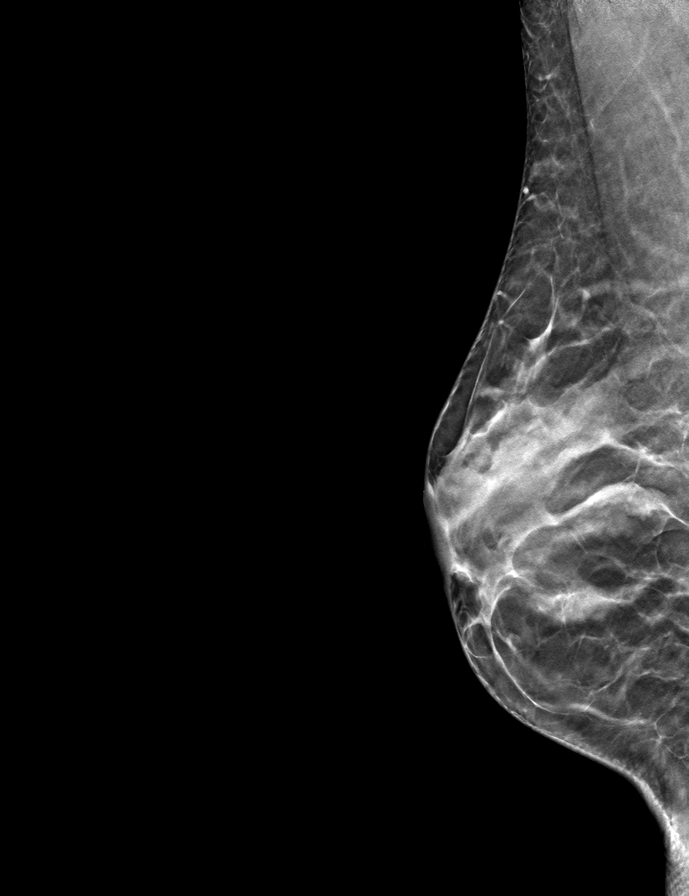

[L MLO tomo · tomo slice 25/48.0]
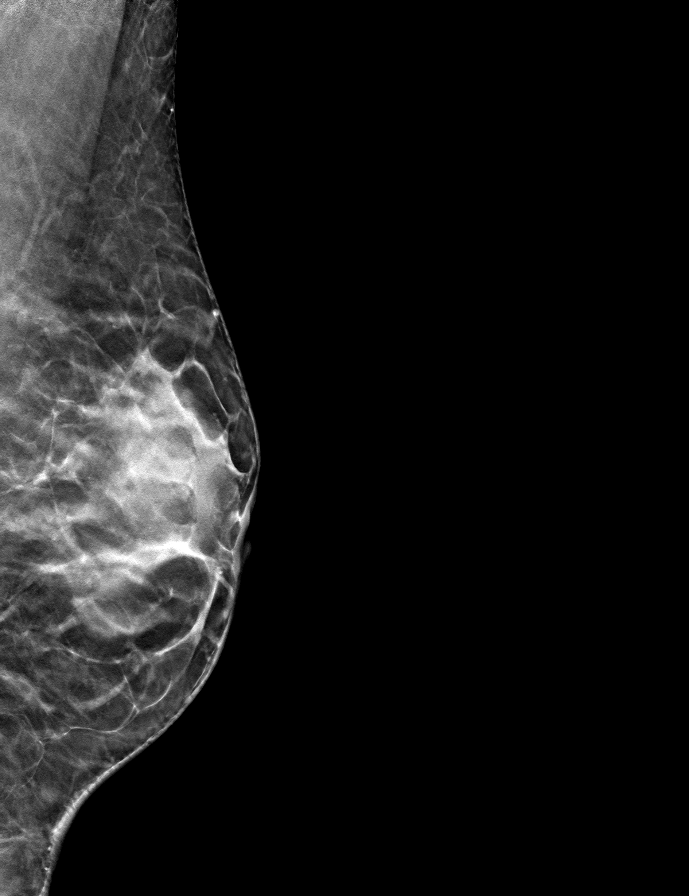

[L CC tomo · tomo slice 27/52.0]
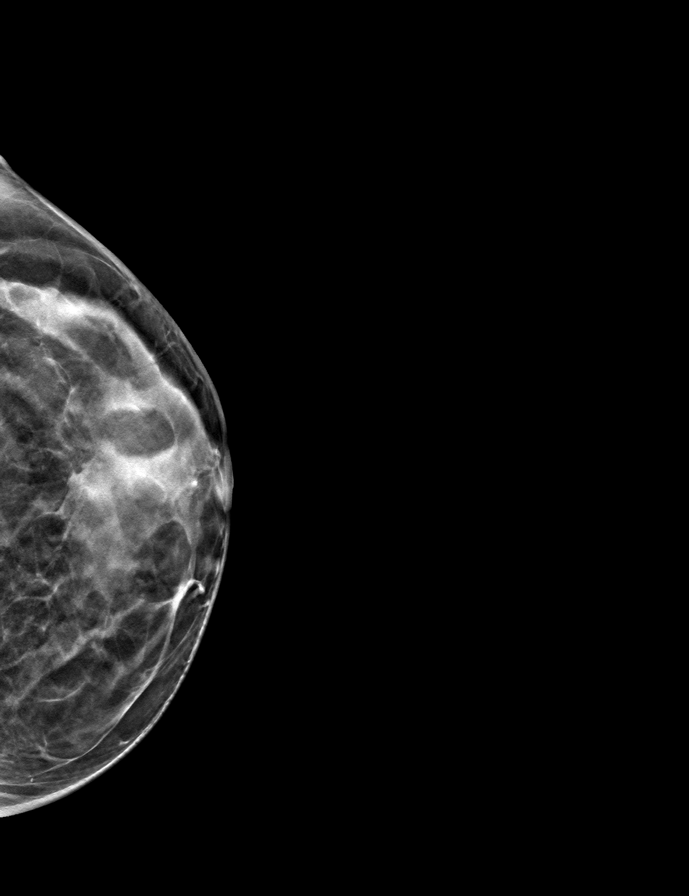

[9 of 24 positions shown; findings below may reference images not displayed]

ACR Breast Density Category c: The breast tissue is heterogeneously
dense, which may obscure small masses
FINDINGS: There are no findings suspicious for malignancy. Images were
processed with CAD.
IMPRESSION: No mammographic evidence of malignancy. A result letter of this
screening mammogram will be mailed directly to the patient.

RECOMMENDATION:
Screening mammogram in one year. (Code:[id number]-[id number])

BI-RADS CATEGORY  1: Negative.
